# Patient Record
Sex: Male | Born: 1970 | Race: Black or African American | Hispanic: No | Marital: Married | Smoking: Current every day smoker
Health system: Southern US, Community
[De-identification: ages and names within clinical notes are randomized; demographics above are authoritative.]

## PROBLEM LIST (undated history)

## (undated) DIAGNOSIS — N289 Disorder of kidney and ureter, unspecified: Secondary | ICD-10-CM

## (undated) DIAGNOSIS — N200 Calculus of kidney: Secondary | ICD-10-CM

---

## 2007-04-17 ENCOUNTER — Emergency Department: Payer: Self-pay | Admitting: Emergency Medicine

## 2008-07-05 ENCOUNTER — Emergency Department: Payer: Self-pay | Admitting: Emergency Medicine

## 2009-08-24 ENCOUNTER — Emergency Department: Payer: Self-pay | Admitting: Emergency Medicine

## 2010-10-06 ENCOUNTER — Emergency Department: Payer: Self-pay | Admitting: Emergency Medicine

## 2012-08-01 ENCOUNTER — Emergency Department: Payer: Self-pay | Admitting: *Deleted

## 2015-01-31 ENCOUNTER — Emergency Department (HOSPITAL_COMMUNITY): Payer: Self-pay

## 2015-01-31 ENCOUNTER — Encounter (HOSPITAL_COMMUNITY): Payer: Self-pay

## 2015-01-31 ENCOUNTER — Emergency Department (HOSPITAL_COMMUNITY)
Admission: EM | Admit: 2015-01-31 | Discharge: 2015-01-31 | Disposition: A | Discharge status: Home / Self Care | Payer: Self-pay | Attending: Emergency Medicine | Admitting: Emergency Medicine

## 2015-01-31 DIAGNOSIS — N201 Calculus of ureter: Secondary | ICD-10-CM | POA: Insufficient documentation

## 2015-01-31 DIAGNOSIS — Z720 Tobacco use: Secondary | ICD-10-CM | POA: Insufficient documentation

## 2015-01-31 DIAGNOSIS — R109 Unspecified abdominal pain: Secondary | ICD-10-CM

## 2015-01-31 HISTORY — DX: Disorder of kidney and ureter, unspecified: N28.9

## 2015-01-31 HISTORY — DX: Calculus of kidney: N20.0

## 2015-01-31 LAB — URINALYSIS, ROUTINE W REFLEX MICROSCOPIC
Glucose, UA: NEGATIVE mg/dL
KETONES UR: NEGATIVE mg/dL
LEUKOCYTES UA: NEGATIVE
NITRITE: NEGATIVE
PROTEIN: 30 mg/dL — AB
SPECIFIC GRAVITY, URINE: 1.031 — AB (ref 1.005–1.030)
UROBILINOGEN UA: 1 mg/dL (ref 0.0–1.0)
pH: 5.5 (ref 5.0–8.0)

## 2015-01-31 LAB — COMPREHENSIVE METABOLIC PANEL
ALT: 22 U/L (ref 0–53)
AST: 27 U/L (ref 0–37)
Albumin: 4 g/dL (ref 3.5–5.2)
Alkaline Phosphatase: 54 U/L (ref 39–117)
Anion gap: 8 (ref 5–15)
BUN: 11 mg/dL (ref 6–23)
CHLORIDE: 104 mmol/L (ref 96–112)
CO2: 27 mmol/L (ref 19–32)
Calcium: 8.5 mg/dL (ref 8.4–10.5)
Creatinine, Ser: 1.26 mg/dL (ref 0.50–1.35)
GFR calc Af Amer: 80 mL/min — ABNORMAL LOW (ref >90)
GFR calc non Af Amer: 69 mL/min — ABNORMAL LOW (ref >90)
Glucose, Bld: 123 mg/dL — ABNORMAL HIGH (ref 70–99)
Potassium: 3.5 mmol/L (ref 3.5–5.1)
Sodium: 139 mmol/L (ref 135–145)
Total Bilirubin: 0.5 mg/dL (ref 0.3–1.2)
Total Protein: 7.5 g/dL (ref 6.0–8.3)

## 2015-01-31 LAB — CBC WITH DIFFERENTIAL/PLATELET
Basophils Absolute: 0 10*3/uL (ref 0.0–0.1)
Basophils Relative: 1 % (ref 0–1)
EOS PCT: 0 % (ref 0–5)
Eosinophils Absolute: 0 10*3/uL (ref 0.0–0.7)
HEMATOCRIT: 43.1 % (ref 39.0–52.0)
Hemoglobin: 14.3 g/dL (ref 13.0–17.0)
LYMPHS PCT: 25 % (ref 12–46)
Lymphs Abs: 1.6 10*3/uL (ref 0.7–4.0)
MCH: 29 pg (ref 26.0–34.0)
MCHC: 33.2 g/dL (ref 30.0–36.0)
MCV: 87.4 fL (ref 78.0–100.0)
Monocytes Absolute: 0.6 10*3/uL (ref 0.1–1.0)
Monocytes Relative: 9 % (ref 3–12)
NEUTROS ABS: 4.1 10*3/uL (ref 1.7–7.7)
Neutrophils Relative %: 65 % (ref 43–77)
Platelets: 227 10*3/uL (ref 150–400)
RBC: 4.93 MIL/uL (ref 4.22–5.81)
RDW: 13 % (ref 11.5–15.5)
WBC: 6.3 10*3/uL (ref 4.0–10.5)

## 2015-01-31 LAB — URINE MICROSCOPIC-ADD ON

## 2015-01-31 MED ORDER — ONDANSETRON HCL 4 MG/2ML IJ SOLN
4.0000 mg | Freq: Once | INTRAMUSCULAR | Status: AC
  Administered 2015-01-31: 4 mg via INTRAVENOUS
  Filled 2015-01-31: qty 2

## 2015-01-31 MED ORDER — MORPHINE SULFATE 4 MG/ML IJ SOLN
4.0000 mg | Freq: Once | INTRAMUSCULAR | Status: AC
  Administered 2015-01-31: 4 mg via INTRAVENOUS
  Filled 2015-01-31: qty 1

## 2015-01-31 MED ORDER — ONDANSETRON HCL 4 MG PO TABS
4.0000 mg | ORAL_TABLET | Freq: Four times a day (QID) | ORAL | Status: AC
Start: 2015-01-31 — End: ?

## 2015-01-31 MED ORDER — OXYCODONE-ACETAMINOPHEN 5-325 MG PO TABS: 1.0000 | ORAL_TABLET | Freq: Four times a day (QID) | ORAL | Status: AC | PRN

## 2015-01-31 MED ORDER — HYDROMORPHONE HCL 1 MG/ML IJ SOLN
1.0000 mg | Freq: Once | INTRAMUSCULAR | Status: AC
  Administered 2015-01-31: 1 mg via INTRAVENOUS
  Filled 2015-01-31: qty 1

## 2015-01-31 MED ORDER — SODIUM CHLORIDE 0.9 % IV BOLUS (SEPSIS)
1000.0000 mL | Freq: Once | INTRAVENOUS | Status: AC
Start: 2015-01-31 — End: 2015-01-31
  Administered 2015-01-31: 1000 mL via INTRAVENOUS

## 2015-01-31 MED ORDER — TAMSULOSIN HCL 0.4 MG PO CAPS: 0.4000 mg | ORAL_CAPSULE | Freq: Every day | ORAL | Status: AC

## 2015-01-31 NOTE — ED Notes (Signed)
Went to draw blood, Pt in CT

## 2015-01-31 NOTE — ED Notes (Signed)
Per [facility]- Pt c/o of sudden onset  of urinary retention and right flank pain. Denies N/V/D and fever. Toradol 30mg  IVP and 150mcg Fentanyl IVP given in route. Pain 5/10

## 2015-01-31 NOTE — ED Notes (Signed)
Pt asked for urine one more time, pt was informed of order to IO cath, agreed to try to void.  Will recheck shortly

## 2015-01-31 NOTE — ED Provider Notes (Signed)
CSN: [id number]     Arrival date & time 01/31/15  4098 History   First MD Initiated Contact with Patient 01/31/15 [phone]     Chief Complaint  Patient presents with  . Flank Pain  . Urine Output    retention  . Abdominal Pain    RLQ     (Consider location/radiation/quality/duration/timing/severity/associated sxs/prior Treatment) Patient is a 44 y.o. male presenting with abdominal pain.  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 Worsened by:  Nothing tried Associated symptoms: no chest pain, no diarrhea, no fever, no nausea and no vomiting     Past Medical History  Diagnosis Date  . Renal disorder   . Kidney stone    History reviewed. No pertinent past surgical history. History reviewed. No pertinent family history. History  Substance Use Topics  . Smoking status: Current Every Day Smoker  . Smokeless tobacco: Not on file  . Alcohol Use: Yes     Comment: social    Review of Systems  Constitutional: Negative for fever.  Cardiovascular: Negative for chest pain.  Gastrointestinal: Positive for abdominal pain. Negative for nausea, vomiting and diarrhea.  Genitourinary: Positive for flank pain.  All other systems reviewed and are negative.     Allergies  Review of patient's allergies indicates no known allergies.  Home Medications   Prior to Admission medications   Not on File   BP 143/97 mmHg  Pulse 71  Temp(Src) 97.9 F (36.6 C) (Oral)  Resp 25  SpO2 99% Physical Exam  Constitutional: He is oriented to person, place, and time. He appears  well-developed and well-nourished. No distress.  HENT:  Head: Normocephalic and atraumatic.  Eyes: Conjunctivae are normal. No scleral icterus.  Neck: Neck supple.  Cardiovascular: Normal rate and intact distal pulses.   Pulmonary/Chest: Effort normal. No stridor. No respiratory distress.  Abdominal: Normal appearance. He exhibits no distension. There is tenderness (right flank and RLQ. mild. ). There is no rigidity, no rebound and no guarding.  Genitourinary: Right testis shows no mass, no swelling and no tenderness. Left testis shows no mass, no swelling and no tenderness.  Neurological: He is alert and oriented to person, place, and time.  Skin: Skin is warm and dry. No rash noted.  Psychiatric: He has a normal mood and affect. His behavior is normal.  Nursing note and vitals reviewed.   ED Course  Procedures (including critical care time) Labs Review Labs Reviewed  URINALYSIS, ROUTINE W REFLEX MICROSCOPIC - Abnormal; Notable for the following:    Color, Urine AMBER (*)    APPearance TURBID (*)    Specific Gravity, Urine 1.031 (*)    Hgb urine dipstick LARGE (*)    Bilirubin Urine SMALL (*)    Protein, ur 30 (*)    All other components within normal limits  COMPREHENSIVE METABOLIC PANEL - Abnormal; Notable for the following:    Glucose, Bld 123 (*)    GFR calc non Af Amer 69 (*)    GFR calc Af Amer 80 (*)    All other components within normal limits  URINE MICROSCOPIC-ADD  ON - Abnormal; Notable for the following:    Squamous Epithelial / LPF MANY (*)    Bacteria, UA MANY (*)    Casts HYALINE CASTS (*)    All other components within normal limits  CBC WITH DIFFERENTIAL/PLATELET    Imaging Review Ct Abdomen Pelvis Wo Contrast  01/31/2015   CLINICAL DATA:  44 year old with right flank pain and urinary retention since this morning.  EXAM: CT ABDOMEN AND PELVIS WITHOUT CONTRAST  TECHNIQUE: Multidetector CT imaging of the abdomen and pelvis was performed following the standard  protocol without IV contrast.  COMPARISON:  None.  FINDINGS: Abdominal structures extend into the left lower chest and findings most likely represent a diaphragmatic hernia rather than just an elevated hemidiaphragm. 2 mm nodule in the right lower lobe on sequence 3, image 9 is indeterminate but could be incidental. No evidence for free air.  There are 2 stones in the right kidney, largest in the interpolar region and measuring 4 mm. There is mild right perinephric stranding. Mild to moderate dilatation of the right ureter due to a 3 mm stone in the distal right ureter just proximal to the ureterovesical junction.  No evidence for left kidney stones or left hydronephrosis. Normal appearance of the adrenal tissue. No gross abnormality to the liver, gallbladder, spleen or pancreas on this non contrast examination. Orientation of the left upper abdominal structures is altered due to the hernia.  No significant free fluid or lymphadenopathy. Urinary bladder is decompressed. No gross abnormality to the prostate gland. Normal appearance of the appendix in the right lower quadrant. No acute abnormality to the small or large bowel. There is an umbilical hernia containing fat.  No acute bone abnormality.  IMPRESSION: Mild-to-moderate right hydroureteronephrosis due to a 3 mm stone in the distal right ureter. There are at least 2 right kidney stones.  Evidence for a left diaphragmatic hernia.  2 mm nodular density in the right lower lobe is indeterminate. If the patient is at high risk for bronchogenic carcinoma, follow-up chest CT at 1 year is recommended. If the patient is at low risk, no follow-up is needed. This recommendation follows the consensus statement: Guidelines for Management of Small Pulmonary Nodules Detected on CT Scans: A Statement from the Fleischner Society as published in Radiology 2005; 237:395-400.  Small umbilical hernia.   Electronically Signed   By: Richarda OverlieAdam  Henn M.D.   On: 01/31/2015 09:59   All  radiology studies independently viewed by me.      EKG Interpretation None      MDM   Final diagnoses:  Right flank pain  Right ureteral stone    Right flank and RLQ abdominal pain.  Somewhat better after EMS gave toradol and fentanyl.  Plan labs, CT.  IV morphine for additional pain control.   CT shows 3mm stone.  Pain controlled with IV narcotics.  Plan dc home with flomax, percocet, zofran, strainer, and Urology follow up.    Blake DivineJohn Etai Copado, MD 01/31/15 [phone]

## 2015-01-31 NOTE — ED Notes (Signed)
Back from CT

## 2015-01-31 NOTE — ED Notes (Signed)
Bed: [id number] Expected date:  Expected time:  Means of arrival:  Comments: Flank pain

## 2015-01-31 NOTE — ED Notes (Signed)
MD at bedside. 

## 2015-01-31 NOTE — Discharge Instructions (Signed)
Kidney Stones  Kidney stones (urolithiasis) are deposits that form inside your kidneys. The intense pain is caused by the stone moving through the urinary tract. When the stone moves, the ureter goes into spasm around the stone. The stone is usually passed in the urine.   CAUSES   · A disorder that makes certain neck glands produce too much parathyroid hormone (primary hyperparathyroidism).  · A buildup of uric acid crystals, similar to gout in your joints.  · Narrowing (stricture) of the ureter.  · A kidney obstruction present at birth (congenital obstruction).  · Previous surgery on the kidney or ureters.  · Numerous kidney infections.  SYMPTOMS   · Feeling sick to your stomach (nauseous).  · Throwing up (vomiting).  · Blood in the urine (hematuria).  · Pain that usually spreads (radiates) to the groin.  · Frequency or urgency of urination.  DIAGNOSIS   · Taking a history and physical exam.  · Blood or urine tests.  · CT scan.  · Occasionally, an examination of the inside of the urinary bladder (cystoscopy) is performed.  TREATMENT   · Observation.  · Increasing your fluid intake.  · Extracorporeal shock wave lithotripsy--This is a noninvasive procedure that uses shock waves to break up kidney stones.  · Surgery may be needed if you have severe pain or persistent obstruction. There are various surgical procedures. Most of the procedures are performed with the use of small instruments. Only small incisions are needed to accommodate these instruments, so recovery time is minimized.  The size, location, and chemical composition are all important variables that will determine the proper choice of action for you. Talk to your health care provider to better understand your situation so that you will minimize the risk of injury to yourself and your kidney.   HOME CARE INSTRUCTIONS   · Drink enough water and fluids to keep your urine clear or pale yellow. This will help you to pass the stone or stone fragments.  · Strain  all urine through the provided strainer. Keep all particulate matter and stones for your health care provider to see. The stone causing the pain may be as small as a grain of salt. It is very important to use the strainer each and every time you pass your urine. The collection of your stone will allow your health care provider to analyze it and verify that a stone has actually passed. The stone analysis will often identify what you can do to reduce the incidence of recurrences.  · Only take over-the-counter or prescription medicines for pain, discomfort, or fever as directed by your health care provider.  · Make a follow-up appointment with your health care provider as directed.  · Get follow-up X-rays if required. The absence of pain does not always mean that the stone has passed. It may have only stopped moving. If the urine remains completely obstructed, it can cause loss of kidney function or even complete destruction of the kidney. It is your responsibility to make sure X-rays and follow-ups are completed. Ultrasounds of the kidney can show blockages and the status of the kidney. Ultrasounds are not associated with any radiation and can be performed easily in a matter of minutes.  SEEK MEDICAL CARE IF:  · You experience pain that is progressive and unresponsive to any pain medicine you have been prescribed.  SEEK IMMEDIATE MEDICAL CARE IF:   · Pain cannot be controlled with the prescribed medicine.  · You have a fever or   shaking chills.  · The severity or intensity of pain increases over 18 hours and is not relieved by pain medicine.  · You develop a new onset of abdominal pain.  · You feel faint or pass out.  · You are unable to urinate.  MAKE SURE YOU:   · Understand these instructions.  · Will watch your condition.  · Will get help right away if you are not doing well or get worse.  Document Released: 11/02/2005 Document Revised: 07/05/2013 Document Reviewed: 04/05/2013  ExitCare® Patient Information ©2015  [employer]. This information is not intended to replace advice given to you by your health care provider. Make sure you discuss any questions you have with your health care provider.    Dietary Guidelines to Help Prevent Kidney Stones  Your risk of kidney stones can be decreased by adjusting the foods you eat. The most important thing you can do is drink enough fluid. You should drink enough fluid to keep your urine clear or pale yellow. The following guidelines provide specific information for the type of kidney stone you have had.  GUIDELINES ACCORDING TO TYPE OF KIDNEY STONE  Calcium Oxalate Kidney Stones  · Reduce the amount of salt you eat. Foods that have a lot of salt cause your body to release excess calcium into your urine. The excess calcium can combine with a substance called oxalate to form kidney stones.  · Reduce the amount of animal protein you eat if the amount you eat is excessive. Animal protein causes your body to release excess calcium into your urine. Ask your dietitian how much protein from animal sources you should be eating.  · Avoid foods that are high in oxalates. If you take vitamins, they should have less than 500 mg of vitamin C. Your body turns vitamin C into oxalates. You do not need to avoid fruits and vegetables high in vitamin C.  Calcium Phosphate Kidney Stones  · Reduce the amount of salt you eat to help prevent the release of excess calcium into your urine.  · Reduce the amount of animal protein you eat if the amount you eat is excessive. Animal protein causes your body to release excess calcium into your urine. Ask your dietitian how much protein from animal sources you should be eating.  · Get enough calcium from food or take a calcium supplement (ask your dietitian for recommendations). Food sources of calcium that do not increase your risk of kidney stones include:  ¨ Broccoli.  ¨ Dairy products, such as cheese and yogurt.  ¨ Pudding.  Uric Acid Kidney Stones  · Do not  have more than 6 oz of animal protein per day.  FOOD SOURCES  Animal Protein Sources  · Meat (all types).  · Poultry.  · Eggs.  · Fish, seafood.  Foods High in Salt  · Salt seasonings.  · Soy sauce.  · Teriyaki sauce.  · Cured and processed meats.  · Salted crackers and snack foods.  · Fast food.  · Canned soups and most canned foods.  Foods High in Oxalates  · Grains:  ¨ Amaranth.  ¨ Barley.  ¨ Grits.  ¨ Wheat germ.  ¨ Bran.  ¨ Buckwheat flour.  ¨ All bran cereals.  ¨ Pretzels.  ¨ Whole wheat bread.  · Vegetables:  ¨ Beans (wax).  ¨ Beets and beet greens.  ¨ Collard greens.  ¨ Eggplant.  ¨ Escarole.  ¨ Leeks.  ¨ Okra.  ¨ Parsley.  ¨ Rutabagas.  ¨   Spinach.  ¨ Swiss chard.  ¨ Tomato paste.  ¨ Fried potatoes.  ¨ Sweet potatoes.  · Fruits:  ¨ Red currants.  ¨ Figs.  ¨ Kiwi.  ¨ Rhubarb.  · Meat and Other Protein Sources:  ¨ Beans (dried).  ¨ Soy burgers and other soybean products.  ¨ Miso.  ¨ Nuts (peanuts, almonds, pecans, cashews, hazelnuts).  ¨ Nut butters.  ¨ Sesame seeds and tahini (paste made of sesame seeds).  ¨ Poppy seeds.  · Beverages:  ¨ Chocolate drink mixes.  ¨ Soy milk.  ¨ Instant iced tea.  ¨ Juices made from high-oxalate fruits or vegetables.  · Other:  ¨ Carob.  ¨ Chocolate.  ¨ Fruitcake.  ¨ Marmalades.  Document Released: 02/27/2011 Document Revised: 11/07/2013 Document Reviewed: 09/29/2013  ExitCare® Patient Information ©2015 [employer]. This information is not intended to replace advice given to you by your health care provider. Make sure you discuss any questions you have with your health care provider.

## 2015-02-02 LAB — URINE CULTURE
CULTURE: NO GROWTH
Colony Count: NO GROWTH

## 2018-02-06 ENCOUNTER — Emergency Department
Admission: EM | Admit: 2018-02-06 | Discharge: 2018-02-06 | Disposition: A | Discharge status: Home / Self Care | Payer: Self-pay | Attending: Student in an Organized Health Care Education/Training Program | Admitting: Student in an Organized Health Care Education/Training Program

## 2018-02-06 ENCOUNTER — Encounter: Payer: Self-pay | Admitting: Emergency Medicine

## 2018-02-06 ENCOUNTER — Other Ambulatory Visit: Payer: Self-pay

## 2018-02-06 ENCOUNTER — Emergency Department: Payer: Self-pay

## 2018-02-06 DIAGNOSIS — Z79899 Other long term (current) drug therapy: Secondary | ICD-10-CM | POA: Insufficient documentation

## 2018-02-06 DIAGNOSIS — R1031 Right lower quadrant pain: Secondary | ICD-10-CM

## 2018-02-06 DIAGNOSIS — F17200 Nicotine dependence, unspecified, uncomplicated: Secondary | ICD-10-CM | POA: Insufficient documentation

## 2018-02-06 DIAGNOSIS — N201 Calculus of ureter: Secondary | ICD-10-CM | POA: Insufficient documentation

## 2018-02-06 LAB — COMPREHENSIVE METABOLIC PANEL
ALBUMIN: 4.2 g/dL (ref 3.5–5.0)
ALT: 12 U/L — ABNORMAL LOW (ref 17–63)
AST: 22 U/L (ref 15–41)
Alkaline Phosphatase: 57 U/L (ref 38–126)
Anion gap: 8 (ref 5–15)
BILIRUBIN TOTAL: 0.5 mg/dL (ref 0.3–1.2)
BUN: 12 mg/dL (ref 6–20)
CO2: 29 mmol/L (ref 22–32)
CREATININE: 1.13 mg/dL (ref 0.61–1.24)
Calcium: 9.1 mg/dL (ref 8.9–10.3)
Chloride: 106 mmol/L (ref 101–111)
GFR calc Af Amer: >60 mL/min (ref >60)
GLUCOSE: 117 mg/dL — AB (ref 65–99)
Potassium: 3.1 mmol/L — ABNORMAL LOW (ref 3.5–5.1)
Sodium: 143 mmol/L (ref 135–145)
TOTAL PROTEIN: 7.6 g/dL (ref 6.5–8.1)

## 2018-02-06 LAB — CBC
HCT: 41.6 % (ref 40.0–52.0)
Hemoglobin: 13.9 g/dL (ref 13.0–18.0)
MCH: 28.8 pg (ref 26.0–34.0)
MCHC: 33.5 g/dL (ref 32.0–36.0)
MCV: 86 fL (ref 80.0–100.0)
PLATELETS: 247 10*3/uL (ref 150–440)
RBC: 4.84 MIL/uL (ref 4.40–5.90)
RDW: 13.7 % (ref 11.5–14.5)
WBC: 8.5 10*3/uL (ref 3.8–10.6)

## 2018-02-06 LAB — URINALYSIS, COMPLETE (UACMP) WITH MICROSCOPIC
BACTERIA UA: NONE SEEN
Bilirubin Urine: NEGATIVE
GLUCOSE, UA: NEGATIVE mg/dL
Ketones, ur: NEGATIVE mg/dL
LEUKOCYTES UA: NEGATIVE
NITRITE: NEGATIVE
PROTEIN: NEGATIVE mg/dL
SPECIFIC GRAVITY, URINE: 1.021 (ref 1.005–1.030)
pH: 7 (ref 5.0–8.0)

## 2018-02-06 LAB — LIPASE, BLOOD: Lipase: 189 U/L — ABNORMAL HIGH (ref 11–51)

## 2018-02-06 MED ORDER — PROMETHAZINE HCL 12.5 MG PO TABS: 12.5000 mg | ORAL_TABLET | Freq: Four times a day (QID) | ORAL | 0 refills | Status: AC | PRN

## 2018-02-06 MED ORDER — PROMETHAZINE HCL 25 MG/ML IJ SOLN
12.5000 mg | Freq: Four times a day (QID) | INTRAMUSCULAR | Status: DC | PRN
  Administered 2018-02-06: 12.5 mg via INTRAVENOUS
  Filled 2018-02-06: qty 1

## 2018-02-06 MED ORDER — KETOROLAC TROMETHAMINE 30 MG/ML IJ SOLN
15.0000 mg | Freq: Once | INTRAMUSCULAR | Status: AC
  Administered 2018-02-06: 15 mg via INTRAVENOUS
  Filled 2018-02-06: qty 1

## 2018-02-06 MED ORDER — MORPHINE SULFATE (PF) 4 MG/ML IV SOLN
4.0000 mg | INTRAVENOUS | Status: DC | PRN
  Administered 2018-02-06: 4 mg via INTRAVENOUS
  Filled 2018-02-06: qty 1

## 2018-02-06 MED ORDER — HYDROCODONE-ACETAMINOPHEN 5-325 MG PO TABS: 1.0000 | ORAL_TABLET | ORAL | 0 refills | Status: AC | PRN

## 2018-02-06 MED ORDER — SODIUM CHLORIDE 0.9 % IV BOLUS (SEPSIS)
1000.0000 mL | Freq: Once | INTRAVENOUS | Status: AC
  Administered 2018-02-06: 1000 mL via INTRAVENOUS

## 2018-02-06 MED ORDER — TAMSULOSIN HCL 0.4 MG PO CAPS: 0.4000 mg | ORAL_CAPSULE | Freq: Every day | ORAL | 0 refills | Status: AC

## 2018-02-06 NOTE — ED Notes (Signed)
Pt given gingerale for PO challenge 

## 2018-02-06 NOTE — ED Triage Notes (Signed)
Pt reports RLQ pain since Friday reports he has a history of kidney stones reports pain feels like a kidney stone pointing a RLQ. Pt denies any N/V/D

## 2018-02-06 NOTE — ED Notes (Signed)
Patient updated on wait on time. Patient verbalizes understanding.

## 2018-02-06 NOTE — ED Provider Notes (Signed)
[facility] Emergency Department Provider Note    First MD Initiated Contact with Patient 02/06/18 0745     (approximate)  I have reviewed the triage vital signs and the nursing notes.   HISTORY  Chief Complaint Abdominal Pain    HPI James Wong. is a 47 y.o. male presents with chief complaint of moderate to severe right flank and right lower quadrant abdominal pain.  She does have a history of kidney stones and states this feels similar.  States he is also having trouble emptying his bladder.  Denies any dysuria.  No fevers.  Has had nausea with this discomfort.  No recent trauma.  Has noted darker colored urine.  Past Medical History:  Diagnosis Date  . Kidney stone   . Renal disorder    No family history on file. History reviewed. No pertinent surgical history. There are no active problems to display for this patient.     Prior to Admission medications   Medication Sig Start Date End Date Taking? Authorizing Provider  HYDROcodone-acetaminophen (NORCO) 5-325 MG tablet Take 1 tablet by mouth every 4 (four) hours as needed for moderate pain. 02/06/18   Willy Eddy, MD  ondansetron (ZOFRAN) 4 MG tablet Take 1 tablet (4 mg total) by mouth every 6 (six) hours. 01/31/15   Blake Divine, MD  oxyCODONE-acetaminophen (PERCOCET/ROXICET) 5-325 MG per tablet Take 1-2 tablets by mouth every 6 (six) hours as needed for severe pain. 01/31/15   Blake Divine, MD  promethazine (PHENERGAN) 12.5 MG tablet Take 1 tablet (12.5 mg total) by mouth every 6 (six) hours as needed for nausea or vomiting. 02/06/18   Willy Eddy, MD  tamsulosin (FLOMAX) 0.4 MG CAPS capsule Take 1 capsule (0.4 mg total) by mouth daily. 01/31/15   Blake Divine, MD  tamsulosin (FLOMAX) 0.4 MG CAPS capsule Take 1 capsule (0.4 mg total) by mouth daily after supper. 02/06/18   Willy Eddy, MD    Allergies Patient has no known allergies.    Social History Social History     Tobacco Use  . Smoking status: Current Every Day Smoker  Substance Use Topics  . Alcohol use: Yes    Comment: social  . Drug use: No    Review of Systems Patient denies headaches, rhinorrhea, blurry vision, numbness, shortness of breath, chest pain, edema, cough, abdominal pain, nausea, vomiting, diarrhea, dysuria, fevers, rashes or hallucinations unless otherwise stated above in HPI. ____________________________________________   PHYSICAL EXAM:  VITAL SIGNS: Vitals:   02/06/18 0930 02/06/18 1000  BP: 114/75 120/74  Pulse:    Resp:    Temp:    SpO2:      Constitutional: Alert and oriented. Uncomfortable appearing and in no acute distress. Eyes: Conjunctivae are normal.  Head: Atraumatic. Nose: No congestion/rhinnorhea. Mouth/Throat: Mucous membranes are moist.   Neck: No stridor. Painless ROM.  Cardiovascular: Normal rate, regular rhythm. Grossly normal heart sounds.  Good peripheral circulation. Respiratory: Normal respiratory effort.  No retractions. Lungs CTAB. Gastrointestinal: Soft and nontender. No distention. No abdominal bruits. No CVA tenderness. Genitourinary:  Musculoskeletal: No lower extremity tenderness nor edema.  No joint effusions. Neurologic:  Normal speech and language. No gross focal neurologic deficits are appreciated. No facial droop Skin:  Skin is warm, dry and intact. No rash noted. Psychiatric: Mood and affect are normal. Speech and behavior are normal.  ____________________________________________   LABS (all labs ordered are listed, but only abnormal results are displayed)  Results for orders placed or performed during  the hospital encounter of 02/06/18 (from the past 24 hour(s))  Lipase, blood     Status: Abnormal   Collection Time: 02/06/18  2:35 AM  Result Value Ref Range   Lipase 189 (H) 11 - 51 U/L  Comprehensive metabolic panel     Status: Abnormal   Collection Time: 02/06/18  2:35 AM  Result Value Ref Range   Sodium 143 135 -  145 mmol/L   Potassium 3.1 (L) 3.5 - 5.1 mmol/L   Chloride 106 101 - 111 mmol/L   CO2 29 22 - 32 mmol/L   Glucose, Bld 117 (H) 65 - 99 mg/dL   BUN 12 6 - 20 mg/dL   Creatinine, Ser 6.571.13 0.61 - 1.24 mg/dL   Calcium 9.1 8.9 - 84.610.3 mg/dL   Total Protein 7.6 6.5 - 8.1 g/dL   Albumin 4.2 3.5 - 5.0 g/dL   AST 22 15 - 41 U/L   ALT 12 (L) 17 - 63 U/L   Alkaline Phosphatase 57 38 - 126 U/L   Total Bilirubin 0.5 0.3 - 1.2 mg/dL   GFR calc non Af Amer >60 >60 mL/min   GFR calc Af Amer >60 >60 mL/min   Anion gap 8 5 - 15  CBC     Status: None   Collection Time: 02/06/18  2:35 AM  Result Value Ref Range   WBC 8.5 3.8 - 10.6 K/uL   RBC 4.84 4.40 - 5.90 MIL/uL   Hemoglobin 13.9 13.0 - 18.0 g/dL   HCT 96.241.6 95.240.0 - 84.152.0 %   MCV 86.0 80.0 - 100.0 fL   MCH 28.8 26.0 - 34.0 pg   MCHC 33.5 32.0 - 36.0 g/dL   RDW 32.413.7 40.111.5 - 02.714.5 %   Platelets 247 150 - 440 K/uL  Urinalysis, Complete w Microscopic     Status: Abnormal   Collection Time: 02/06/18  2:35 AM  Result Value Ref Range   Color, Urine YELLOW (A) YELLOW   APPearance TURBID (A) CLEAR   Specific Gravity, Urine 1.021 1.005 - 1.030   pH 7.0 5.0 - 8.0   Glucose, UA NEGATIVE NEGATIVE mg/dL   Hgb urine dipstick LARGE (A) NEGATIVE   Bilirubin Urine NEGATIVE NEGATIVE   Ketones, ur NEGATIVE NEGATIVE mg/dL   Protein, ur NEGATIVE NEGATIVE mg/dL   Nitrite NEGATIVE NEGATIVE   Leukocytes, UA NEGATIVE NEGATIVE   RBC / HPF TOO NUMEROUS TO COUNT 0 - 5 RBC/hpf   WBC, UA 0-5 0 - 5 WBC/hpf   Bacteria, UA NONE SEEN NONE SEEN   Squamous Epithelial / LPF 0-5 (A) NONE SEEN   Mucus PRESENT    Amorphous Crystal PRESENT    ____________________________________________ ____________________________________________  RADIOLOGY  I personally reviewed all radiographic images ordered to evaluate for the above acute complaints and reviewed radiology reports and findings.  These findings were personally discussed with the patient.  Please see medical record for  radiology report.  ____________________________________________   PROCEDURES  Procedure(s) performed:  Procedures    Critical Care performed: no ____________________________________________   INITIAL IMPRESSION / ASSESSMENT AND PLAN / ED COURSE  Pertinent labs & imaging results that were available during my care of the patient were reviewed by me and considered in my medical decision making (see chart for details).  DDX: stone, pyelo, colitis, appy, u ti  Hilery C Magnus Ivanhompson Jr. is a 47 y.o. who presents to the ED with p/w right flank pain and right lq pain. No fevers, no systemic symptoms. - urinary symptoms. Denies trauma  or injury. Afebrile in ED. Exam as above. Flank TTP, otherwise abdominal exam is benign. No peritoneal signs. Possible kidney stone, cystitis, or pyelonephritis.Marland Kitchen UA with hematuria, no infection.. CT Stone with acute ureterolithiasis with mild hydro.  Is mildly elevated lipase of uncertain etiology but this is not clinically consistent with acute pancreatitis.  Clinical picture is not consistent with appendicitis, diverticulitis, pancreatitis, cholecystitis, bowel perforation, aortic dissection, splenic injury or acute abdominal process at this time.    Pain improved, tolerating PO. Repeat ABD exam benign, will plan supportive treatment and early follow up for recheck.       As part of my medical decision making, I reviewed the following data within the electronic MEDICAL RECORD NUMBER Nursing notes reviewed and incorporated, Labs reviewed, notes from prior ED visits.   ____________________________________________   FINAL CLINICAL IMPRESSION(S) / ED DIAGNOSES  Final diagnoses:  Ureterolithiasis  Right lower quadrant abdominal pain      NEW MEDICATIONS STARTED DURING THIS VISIT:  New Prescriptions   HYDROCODONE-ACETAMINOPHEN (NORCO) 5-325 MG TABLET    Take 1 tablet by mouth every 4 (four) hours as needed for moderate pain.   PROMETHAZINE (PHENERGAN)  12.5 MG TABLET    Take 1 tablet (12.5 mg total) by mouth every 6 (six) hours as needed for nausea or vomiting.   TAMSULOSIN (FLOMAX) 0.4 MG CAPS CAPSULE    Take 1 capsule (0.4 mg total) by mouth daily after supper.     Note:  This document was prepared using Dragon voice recognition software and may include unintentional dictation errors.    Willy Eddy, MD 02/06/18 1028

## 2019-07-19 IMAGING — CT CT RENAL STONE PROTOCOL
2 of 4 series · 16 of 46 positions shown, 18 images · non-contrast
Comparison: CT abdomen pelvis 01/31/2015

CLINICAL DATA: Right lower quadrant flank pain. History of kidney
stones.

EXAM:
CT ABDOMEN AND PELVIS WITHOUT CONTRAST
TECHNIQUE: Multidetector CT imaging of the abdomen and pelvis was performed
following the standard protocol without IV contrast.

[Series 2: stone full standard · axial · 0.80mm/px · z∈[-1026,-561]mm · 13 of 103 slices shown, 15 images]
[im 5/103  soft-tissue]
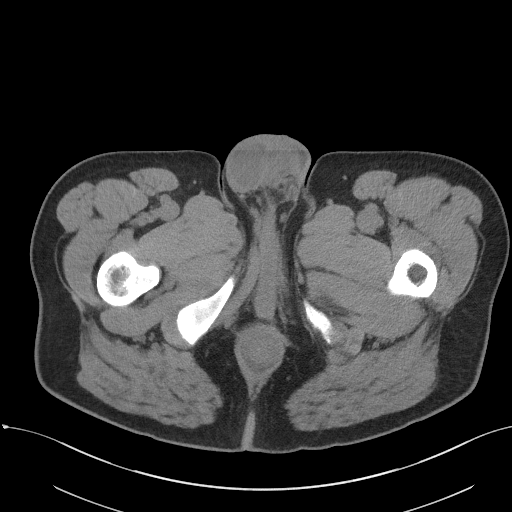
[im 5/103  bone]
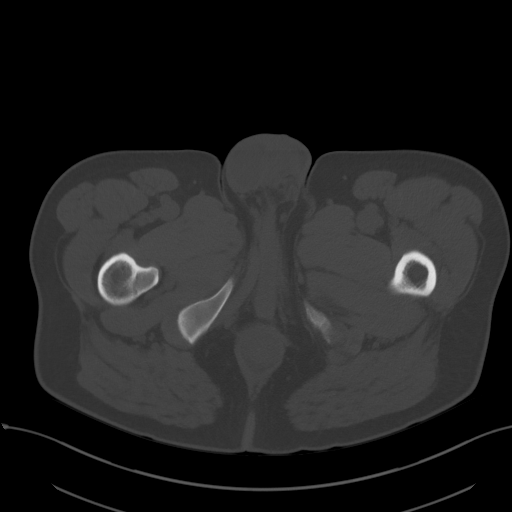
[im 14/103  soft-tissue]
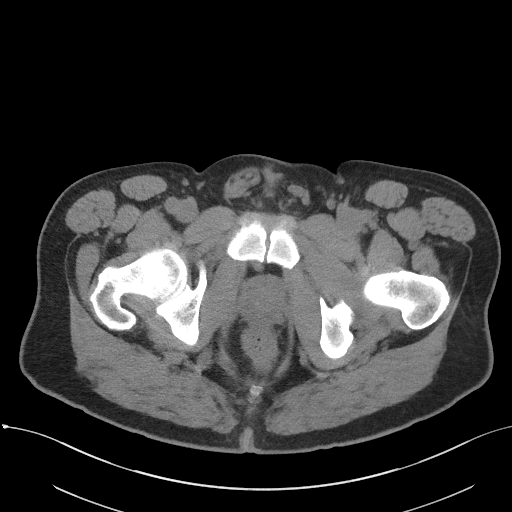
[im 23/103  soft-tissue]
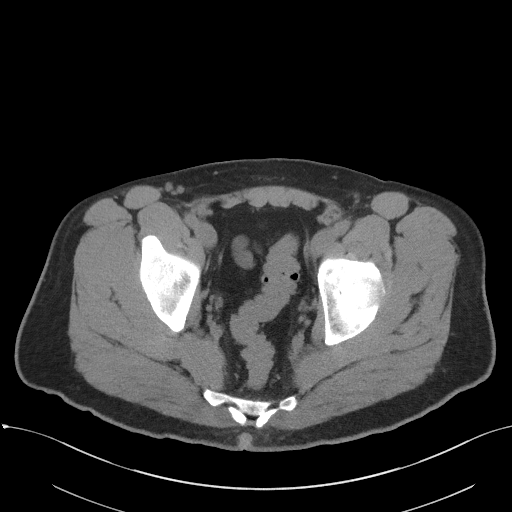
[im 27/103  soft-tissue]
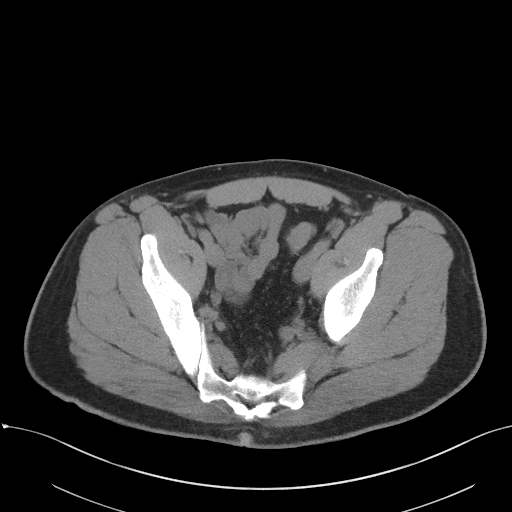
[im 36/103  soft-tissue]
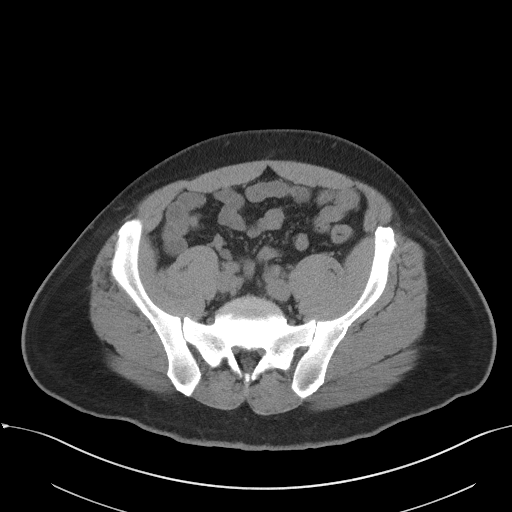
[im 45/103  soft-tissue]
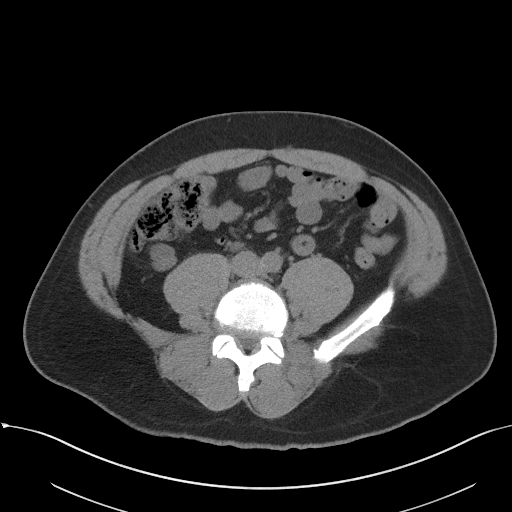
[im 54/103  soft-tissue]
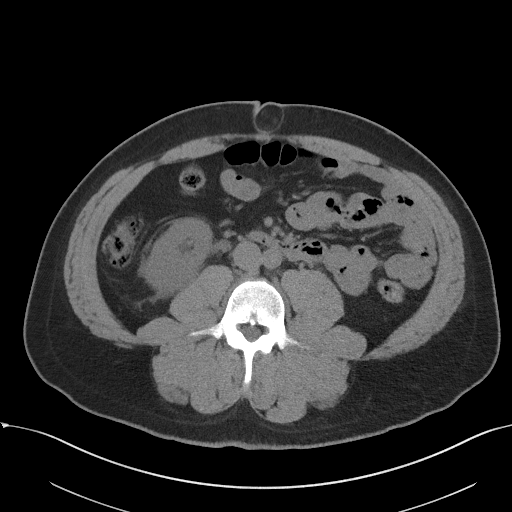
[im 58/103  soft-tissue]
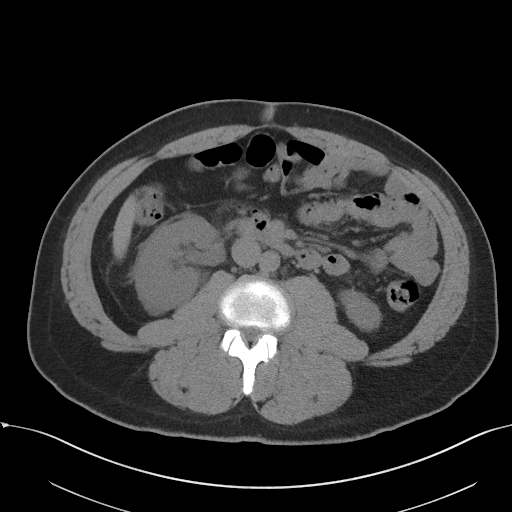
[im 67/103  soft-tissue]
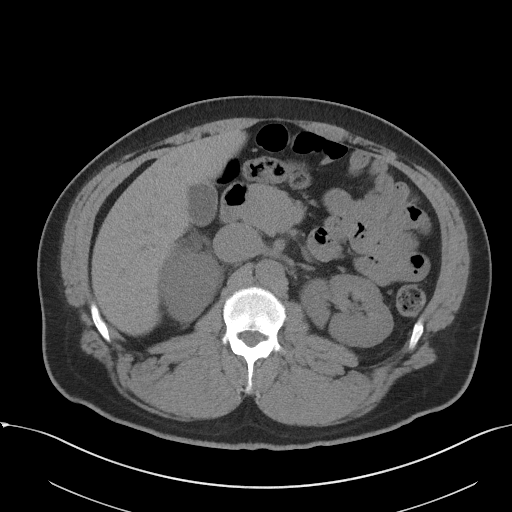
[im 67/103  bone]
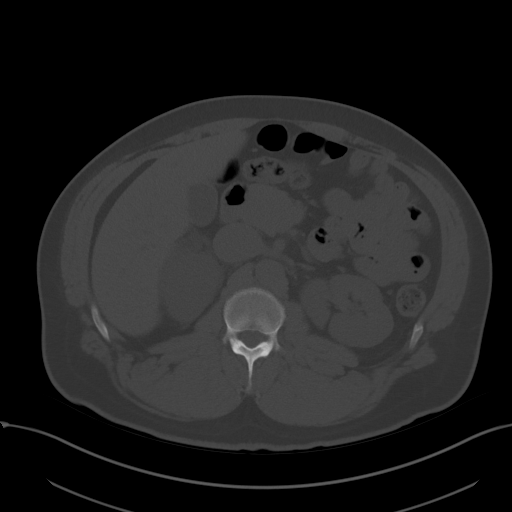
[im 76/103  soft-tissue]
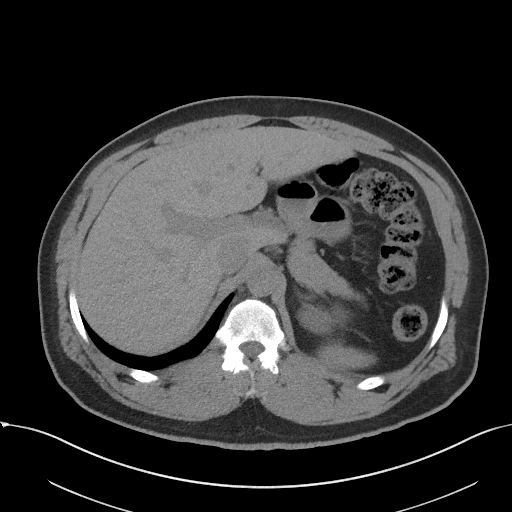
[im 80/103  soft-tissue]
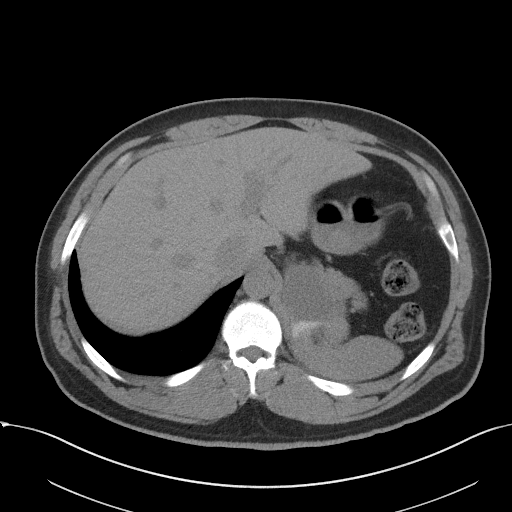
[im 89/103  soft-tissue]
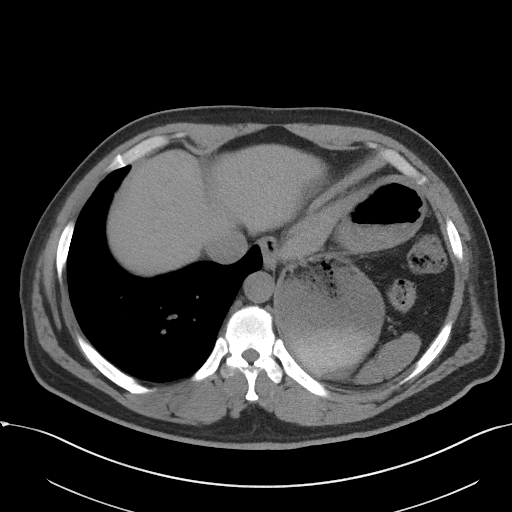
[im 98/103  soft-tissue]
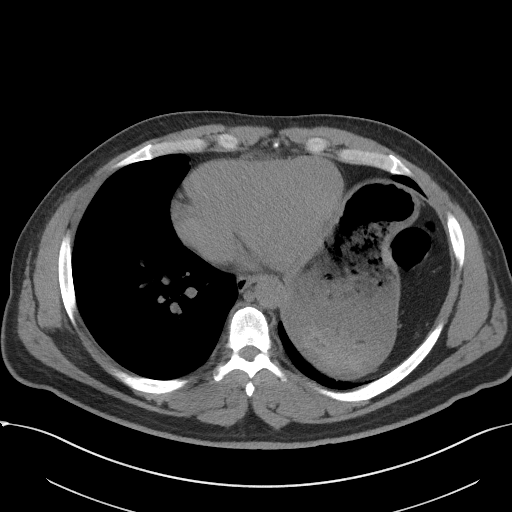

[Series 5: coronal · coronal · 0.75mm/px · 3 of 146 slices shown]
[im 49/146  soft-tissue]
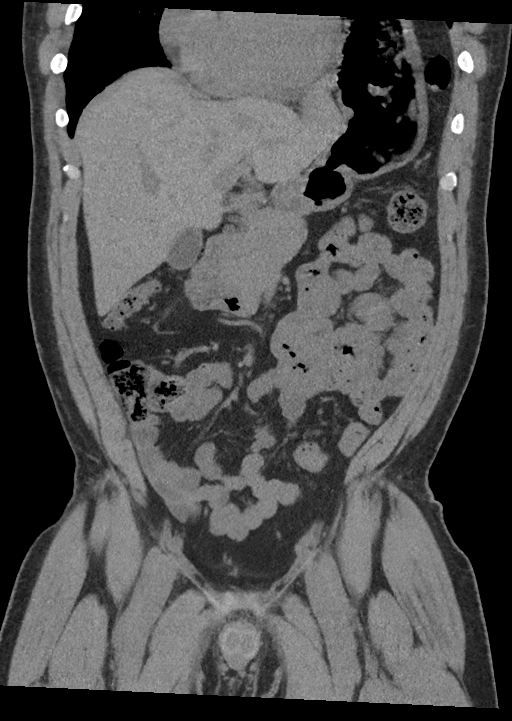
[im 65/146  soft-tissue]
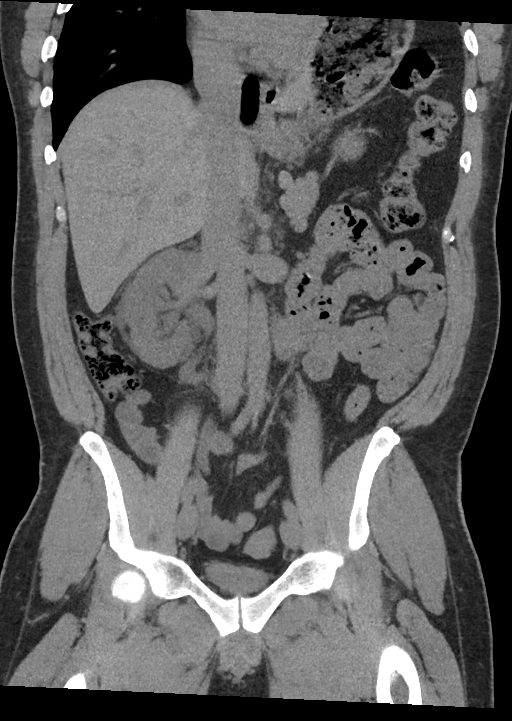
[im 81/146  soft-tissue]
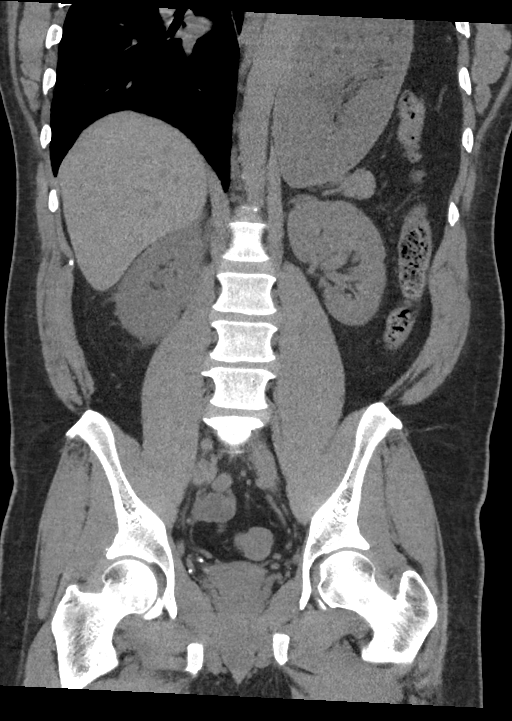

[16 of 46 positions shown; findings below may reference images not displayed]

FINDINGS: Lower chest: No basilar pulmonary nodules or pleural effusion. No
apical pericardial effusion.

Hepatobiliary: Normal hepatic contours and density. No intra- or
extrahepatic biliary dilatation. Normal gallbladder.

Pancreas: Normal parenchymal contours without ductal dilatation. No
peripancreatic fluid collection.

Spleen: Normal.

Adrenals/Urinary Tract:

--Adrenal glands: Normal.

--Right kidney/ureter: 5 x 3 mm stone at the right ureterovesical
junction, causing moderate right hydroureteronephrosis and
perinephric stranding. No other right nephroureterolithiasis.

--Left kidney/ureter: Non-obstructing 2 mm stone at the left lower
pole. Normal left ureter.

--Urinary bladder: Normal appearance for the degree of distention.

Stomach/Bowel:

--Stomach/Duodenum: No hiatal hernia or other gastric abnormality.
Normal duodenal course.

--Small bowel: No dilatation or inflammation.

--Colon: No focal abnormality.

--Appendix: Normal.

Vascular/Lymphatic: Normal course and caliber of the major abdominal
vessels. No abdominal or pelvic lymphadenopathy.

Reproductive: Normal prostate and seminal vesicles.

Musculoskeletal. Multilevel degenerative disc disease and facet
arthrosis. No bony spinal canal stenosis.

Other: None.
IMPRESSION: 1. Right-sided obstructive uropathy with 5 x 3 mm ureterovesical
junction stone causing moderate right hydroureteronephrosis and
perinephric stranding.
2. Nonobstructing left lower pole 2 mm calculus.

## 2019-12-09 ENCOUNTER — Emergency Department
Admission: EM | Admit: 2019-12-09 | Discharge: 2019-12-09 | Disposition: A | Discharge status: Home / Self Care | Payer: Self-pay | Attending: Student | Admitting: Student

## 2019-12-09 ENCOUNTER — Other Ambulatory Visit: Payer: Self-pay

## 2019-12-09 ENCOUNTER — Emergency Department: Payer: Self-pay

## 2019-12-09 DIAGNOSIS — Z87442 Personal history of urinary calculi: Secondary | ICD-10-CM | POA: Insufficient documentation

## 2019-12-09 DIAGNOSIS — N200 Calculus of kidney: Secondary | ICD-10-CM | POA: Insufficient documentation

## 2019-12-09 DIAGNOSIS — F17200 Nicotine dependence, unspecified, uncomplicated: Secondary | ICD-10-CM | POA: Insufficient documentation

## 2019-12-09 LAB — URINALYSIS, COMPLETE (UACMP) WITH MICROSCOPIC
Bacteria, UA: NONE SEEN
Bilirubin Urine: NEGATIVE
Glucose, UA: NEGATIVE mg/dL
Ketones, ur: NEGATIVE mg/dL
Leukocytes,Ua: NEGATIVE
Nitrite: NEGATIVE
Protein, ur: NEGATIVE mg/dL
RBC / HPF: >50 RBC/hpf — ABNORMAL HIGH (ref 0–5)
Specific Gravity, Urine: 1.018 (ref 1.005–1.030)
pH: 5 (ref 5.0–8.0)

## 2019-12-09 LAB — CBC
HCT: 45.1 % (ref 39.0–52.0)
Hemoglobin: 15.1 g/dL (ref 13.0–17.0)
MCH: 29.8 pg (ref 26.0–34.0)
MCHC: 33.5 g/dL (ref 30.0–36.0)
MCV: 89.1 fL (ref 80.0–100.0)
Platelets: 264 10*3/uL (ref 150–400)
RBC: 5.06 MIL/uL (ref 4.22–5.81)
RDW: 12.6 % (ref 11.5–15.5)
WBC: 7.9 10*3/uL (ref 4.0–10.5)
nRBC: 0 % (ref 0.0–0.2)

## 2019-12-09 LAB — COMPREHENSIVE METABOLIC PANEL
ALT: 22 U/L (ref 0–44)
AST: 28 U/L (ref 15–41)
Albumin: 3.9 g/dL (ref 3.5–5.0)
Alkaline Phosphatase: 61 U/L (ref 38–126)
Anion gap: 10 (ref 5–15)
BUN: 12 mg/dL (ref 6–20)
CO2: 26 mmol/L (ref 22–32)
Calcium: 8.8 mg/dL — ABNORMAL LOW (ref 8.9–10.3)
Chloride: 106 mmol/L (ref 98–111)
Creatinine, Ser: 1.21 mg/dL (ref 0.61–1.24)
GFR calc Af Amer: >60 mL/min (ref >60)
GFR calc non Af Amer: >60 mL/min (ref >60)
Glucose, Bld: 153 mg/dL — ABNORMAL HIGH (ref 70–99)
Potassium: 3.7 mmol/L (ref 3.5–5.1)
Sodium: 142 mmol/L (ref 135–145)
Total Bilirubin: 0.7 mg/dL (ref 0.3–1.2)
Total Protein: 7.6 g/dL (ref 6.5–8.1)

## 2019-12-09 LAB — LIPASE, BLOOD: Lipase: 77 U/L — ABNORMAL HIGH (ref 11–51)

## 2019-12-09 MED ORDER — HYDROMORPHONE HCL 1 MG/ML IJ SOLN
1.0000 mg | Freq: Once | INTRAMUSCULAR | Status: AC
  Administered 2019-12-09: 08:00:00 1 mg via INTRAVENOUS
  Filled 2019-12-09: qty 1

## 2019-12-09 MED ORDER — FENTANYL CITRATE (PF) 100 MCG/2ML IJ SOLN
50.0000 ug | INTRAMUSCULAR | Status: AC | PRN
  Administered 2019-12-09: 50 ug via INTRAVENOUS
  Filled 2019-12-09: qty 2

## 2019-12-09 MED ORDER — OXYCODONE-ACETAMINOPHEN 7.5-325 MG PO TABS: 1.0000 | ORAL_TABLET | Freq: Four times a day (QID) | ORAL | 0 refills | Status: AC | PRN

## 2019-12-09 MED ORDER — SODIUM CHLORIDE 0.9 % IV BOLUS
1000.0000 mL | Freq: Once | INTRAVENOUS | Status: AC
  Administered 2019-12-09: 06:00:00 1000 mL via INTRAVENOUS

## 2019-12-09 MED ORDER — FENTANYL CITRATE (PF) 100 MCG/2ML IJ SOLN
INTRAMUSCULAR | Status: AC
  Administered 2019-12-09: 50 ug via INTRAVENOUS
  Filled 2019-12-09: qty 2

## 2019-12-09 NOTE — ED Triage Notes (Signed)
Patient c/o LLQ pain X 3 days with increasing severity today. Patient reports last BM this am, normal color, smaller than normal per patient. Patient reports constant urge to urinate, with decreased urinary output.

## 2019-12-09 NOTE — Discharge Instructions (Addendum)
Follow discharge care instructions take medication as directed. °

## 2019-12-09 NOTE — ED Notes (Signed)
Pt reports pain level of 7 in his abdomen at this time. Pt denies any nausea.

## 2019-12-09 NOTE — ED Provider Notes (Signed)
[facility] Emergency Department Provider Note   ____________________________________________   First MD Initiated Contact with Patient 12/09/19 0809     (approximate)  I have reviewed the triage vital signs and the nursing notes.   HISTORY  Chief Complaint Abdominal Pain    HPI James Wingerter. is a 49 y.o. male patient presents with left lower quadrant abdominal pain for 3 days.  Patient state pain increased today.  Patient denies nausea, vomiting, diarrhea.  Patient stated urinary urgency with decreased urinary output.  Patient denies fever/chills complaint.  Patient stated  mild left CVA pain.  Past history of kidney stones.  Rates pain as 8/10.  Described pain as "achy".  No palliative measure for complaint.     Past Medical History:  Diagnosis Date  . Kidney stone   . Renal disorder     There are no problems to display for this patient.   History reviewed. No pertinent surgical history.  Prior to Admission medications   Medication Sig Start Date End Date Taking? Authorizing Provider  HYDROcodone-acetaminophen (NORCO) 5-325 MG tablet Take 1 tablet by mouth every 4 (four) hours as needed for moderate pain. 02/06/18   Willy Eddy, MD  ondansetron (ZOFRAN) 4 MG tablet Take 1 tablet (4 mg total) by mouth every 6 (six) hours. 01/31/15   Blake Divine, MD  oxyCODONE-acetaminophen (PERCOCET) 7.5-325 MG tablet Take 1 tablet by mouth every 6 (six) hours as needed. 12/09/19   Joni Reining, PA-James  oxyCODONE-acetaminophen (PERCOCET/ROXICET) 5-325 MG per tablet Take 1-2 tablets by mouth every 6 (six) hours as needed for severe pain. 01/31/15   Blake Divine, MD  promethazine (PHENERGAN) 12.5 MG tablet Take 1 tablet (12.5 mg total) by mouth every 6 (six) hours as needed for nausea or vomiting. 02/06/18   Willy Eddy, MD  tamsulosin (FLOMAX) 0.4 MG CAPS capsule Take 1 capsule (0.4 mg total) by mouth daily. 01/31/15   Blake Divine, MD    tamsulosin (FLOMAX) 0.4 MG CAPS capsule Take 1 capsule (0.4 mg total) by mouth daily after supper. 02/06/18   Willy Eddy, MD    Allergies Patient has no known allergies.  No family history on file.  Social History Social History   Tobacco Use  . Smoking status: Current Every Day Smoker  . Smokeless tobacco: Never Used  Substance Use Topics  . Alcohol use: Yes    Comment: social  . Drug use: No    Review of Systems Constitutional: No fever/chills Eyes: No visual changes. ENT: No sore throat. Cardiovascular: Denies chest pain. Respiratory: Denies shortness of breath. Gastrointestinal: Left lower abdominal pain.  No nausea, no vomiting.  No diarrhea.  No constipation. Genitourinary: Negative for dysuria.  Urinary urgency and decreased urine output. Musculoskeletal: Negative for back pain. Skin: Negative for rash. Neurological: Negative for headaches, focal weakness or numbness.   ____________________________________________   PHYSICAL EXAM:  VITAL SIGNS: ED Triage Vitals [12/09/19 0525]  Enc Vitals Group     BP (!) 155/101     Pulse Rate 80     Resp 18     Temp 99 F (37.2 James)     Temp src      SpO2 99 %     Weight 240 lb (108.9 kg)     Height 6' (1.829 m)     Head Circumference      Peak Flow      Pain Score 8     Pain Loc  Pain Edu?      Excl. in GC?    Constitutional: Alert and oriented.  Moderate distress.   Cardiovascular: Normal rate, regular rhythm. Grossly normal heart sounds.  Good peripheral circulation.  Elevated blood pressure Respiratory: Normal respiratory effort.  No retractions. Lungs CTAB. Gastrointestinal: Soft and nontender. No distention. No abdominal bruits. No CVA tenderness. Genitourinary: Deferred Neurologic:  Normal speech and language. No gross focal neurologic deficits are appreciated. No gait instability. Skin:  Skin is warm, dry and intact. No rash noted. Psychiatric: Mood and affect are normal. Speech and behavior  are normal.  ____________________________________________   LABS (all labs ordered are listed, but only abnormal results are displayed)  Labs Reviewed  LIPASE, BLOOD - Abnormal; Notable for the following components:      Result Value   Lipase 77 (*)    All other components within normal limits  COMPREHENSIVE METABOLIC PANEL - Abnormal; Notable for the following components:   Glucose, Bld 153 (*)    Calcium 8.8 (*)    All other components within normal limits  URINALYSIS, COMPLETE (UACMP) WITH MICROSCOPIC - Abnormal; Notable for the following components:   Color, Urine YELLOW (*)    APPearance CLEAR (*)    Hgb urine dipstick LARGE (*)    RBC / HPF >50 (*)    All other components within normal limits  CBC   ____________________________________________  EKG   ____________________________________________  RADIOLOGY  ED MD interpretation:    Official radiology report(s): CT Renal Stone Study  Result Date: 12/09/2019 CLINICAL DATA:  Left lower quadrant/flank pain x3 days EXAM: CT ABDOMEN AND PELVIS WITHOUT CONTRAST TECHNIQUE: Multidetector CT imaging of the abdomen and pelvis was performed following the standard protocol without IV contrast. COMPARISON:  02/06/2018 FINDINGS: Lower chest: Eventration of the left hemidiaphragm. Lingular scarring/atelectasis. Hepatobiliary: Unenhanced liver is unremarkable. Gallbladder is unremarkable. No intrahepatic or extrahepatic ductal dilatation. Pancreas: Within normal limits. Spleen: Within normal limits. Adrenals/Urinary Tract: Adrenal glands are within normal limits. Right kidney is within normal limits. 2 mm nonobstructing interpolar left renal calculus (series 2/image 51). Moderate left perirenal edema with perinephric stranding. Mild left hydroureteronephrosis. Associated 3 mm distal left ureteral calculus just above the UVJ (series 2/image 95). Thick-walled bladder, although underdistended. Stomach/Bowel: Stomach is within normal limits. No  evidence of bowel obstruction. Normal appendix (series 2/image 70). No colonic wall thickening or inflammatory changes. Vascular/Lymphatic: No evidence of abdominal aortic aneurysm. Atherosclerotic calcifications of the abdominal aorta and branch vessels. No suspicious abdominopelvic lymphadenopathy. Reproductive: Prostate is unremarkable. Other: No abdominopelvic ascites. Small fat containing periumbilical hernia (series 2/image 63). Musculoskeletal: Mild degenerative changes of the visualized thoracolumbar spine. IMPRESSION: 3 mm distal left ureteral calculus just above the UVJ. Associated mild left hydroureteronephrosis. Additional 2 mm nonobstructing interpolar left renal calculus. Electronically Signed   By: Charline Bills M.D.   On: 12/09/2019 07:13    ____________________________________________   PROCEDURES  Procedure(s) performed (including Critical Care):  Procedures   ____________________________________________   INITIAL IMPRESSION / ASSESSMENT AND PLAN / ED COURSE  As part of my medical decision making, I reviewed the following data within the electronic MEDICAL RECORD NUMBER     Patient presents with 3 days of increasing left lower quadrant abdominal pain.  Discussed labs and CT findings with patient consistent with hematuria and left kidney stones.  Patient given discharge care instruction and advised to follow-up with urology if no improvement in 3 to 5 days.  Return to ED if condition worsens.    [employer]  James Kimarion Chery. was evaluated in Emergency Department on 12/09/2019 for the symptoms described in the history of present illness. He was evaluated in the context of the global COVID-19 pandemic, which necessitated consideration that the patient might be at risk for infection with the SARS-CoV-2 virus that causes COVID-19. Institutional protocols and algorithms that pertain to the evaluation of patients at risk for COVID-19 are in a state of rapid change based on information  released by regulatory bodies including the CDC and federal and state organizations. These policies and algorithms were followed during the patient's care in the ED.       ____________________________________________   FINAL CLINICAL IMPRESSION(S) / ED DIAGNOSES  Final diagnoses:  Kidney stones     ED Discharge Orders         Ordered    oxyCODONE-acetaminophen (PERCOCET) 7.5-325 MG tablet  Every 6 hours PRN     12/09/19 1601           Note:  This document was prepared using Dragon voice recognition software and may include unintentional dictation errors.    Sable Feil, PA-James 12/09/19 0932    Lilia Pro., MD 12/10/19 [phone]

## 2021-05-20 IMAGING — CT CT RENAL STONE PROTOCOL
2 of 4 series · 15 of 46 positions shown, 17 images · non-contrast
Comparison: 02/06/2018

CLINICAL DATA: Left lower quadrant/flank pain x3 days

EXAM:
CT ABDOMEN AND PELVIS WITHOUT CONTRAST
TECHNIQUE: Multidetector CT imaging of the abdomen and pelvis was performed
following the standard protocol without IV contrast.

[Series 2: stone full standard · axial · 0.84mm/px · z∈[-924,-414]mm · 12 of 114 slices shown, 14 images]
[im 6/114  soft-tissue]
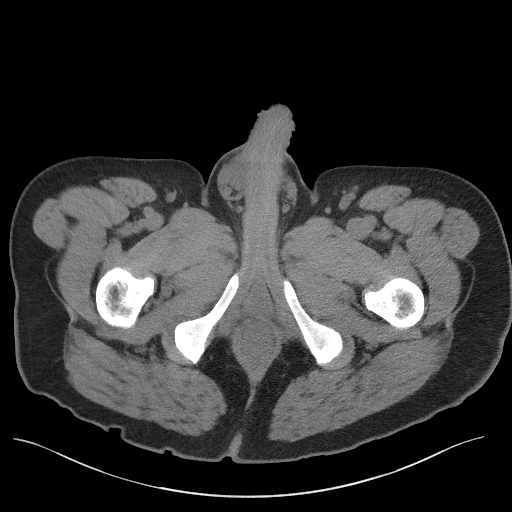
[im 6/114  bone]
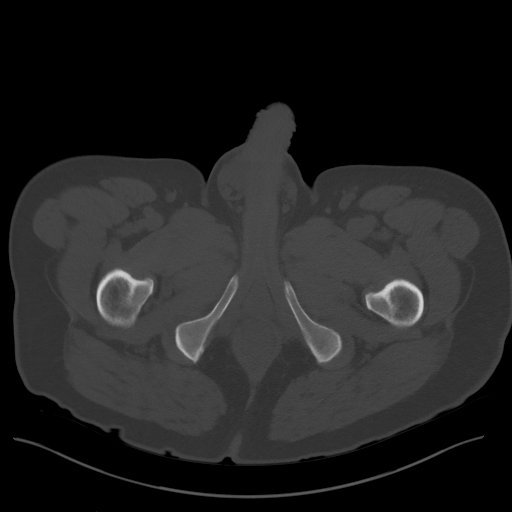
[im 16/114  soft-tissue]
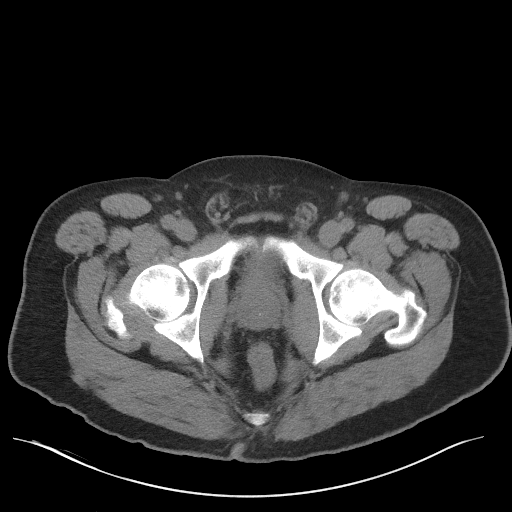
[im 26/114  soft-tissue]
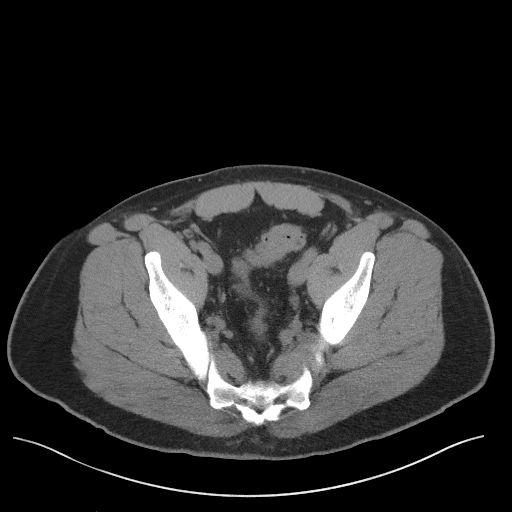
[im 36/114  soft-tissue]
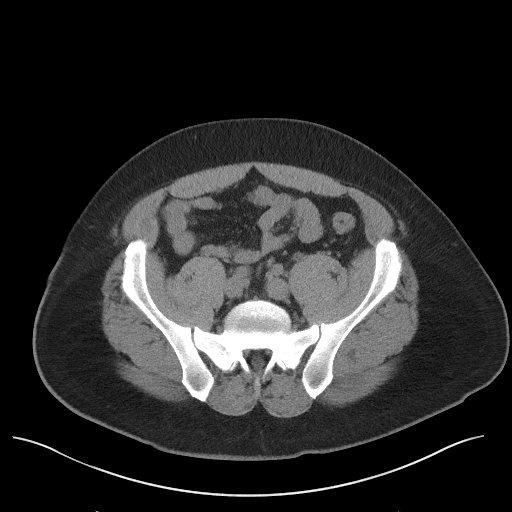
[im 42/114  soft-tissue]
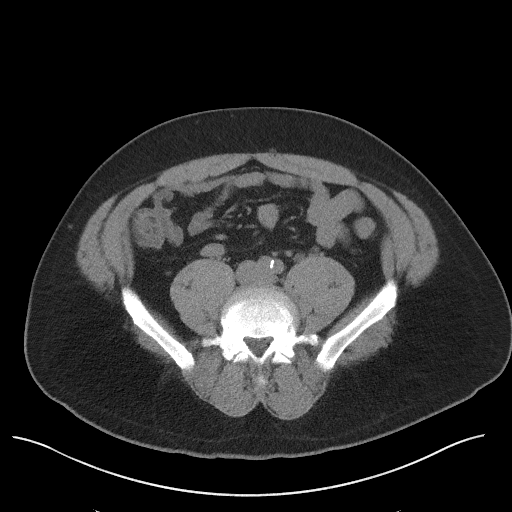
[im 52/114  soft-tissue]
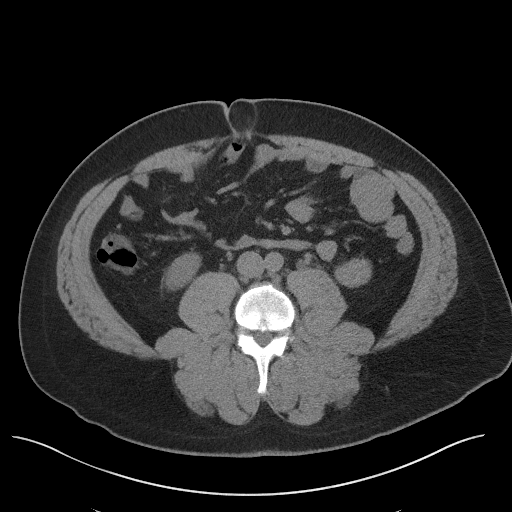
[im 62/114  soft-tissue]
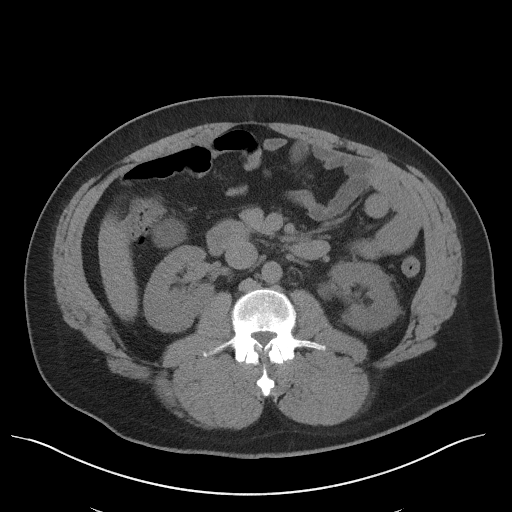
[im 72/114  soft-tissue]
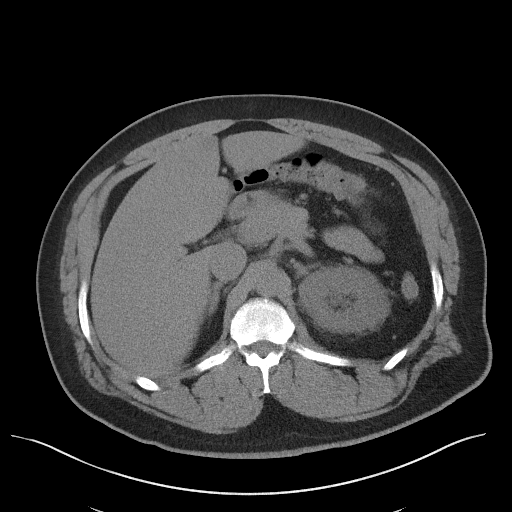
[im 78/114  soft-tissue]
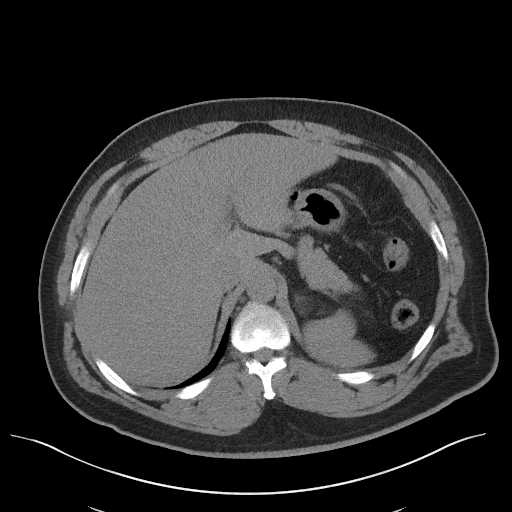
[im 78/114  bone]
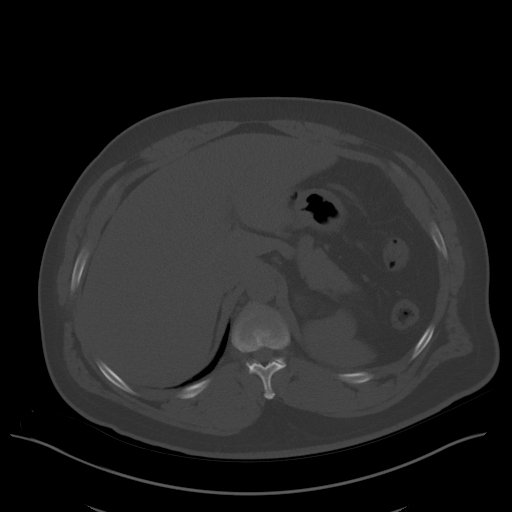
[im 88/114  soft-tissue]
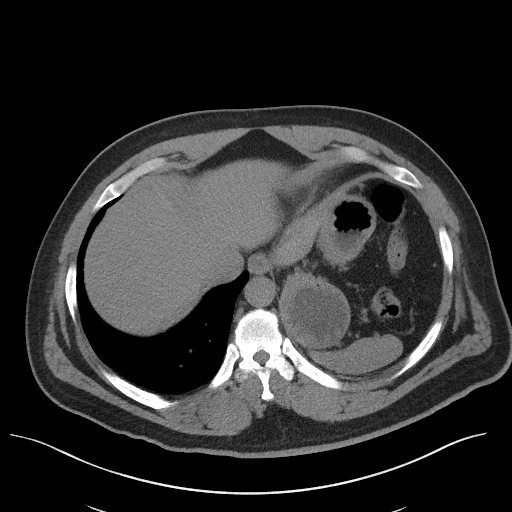
[im 98/114  soft-tissue]
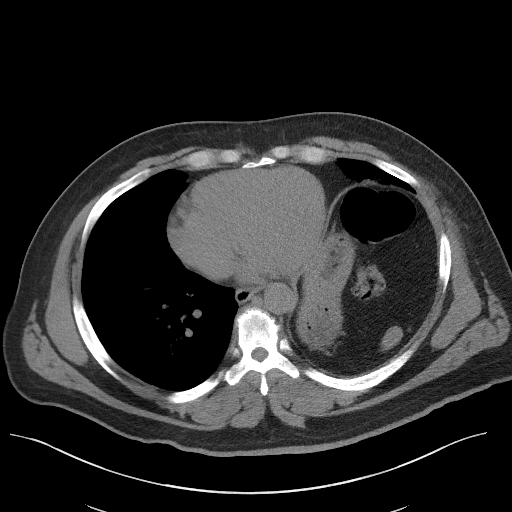
[im 108/114  soft-tissue]
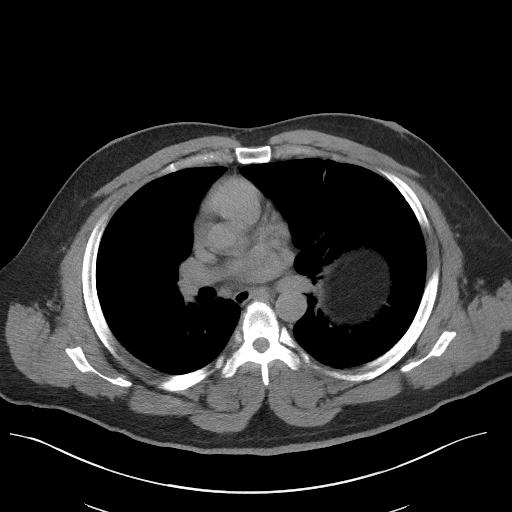

[Series 5: coronal · coronal · 0.83mm/px · 3 of 142 slices shown]
[im 48/142  soft-tissue]
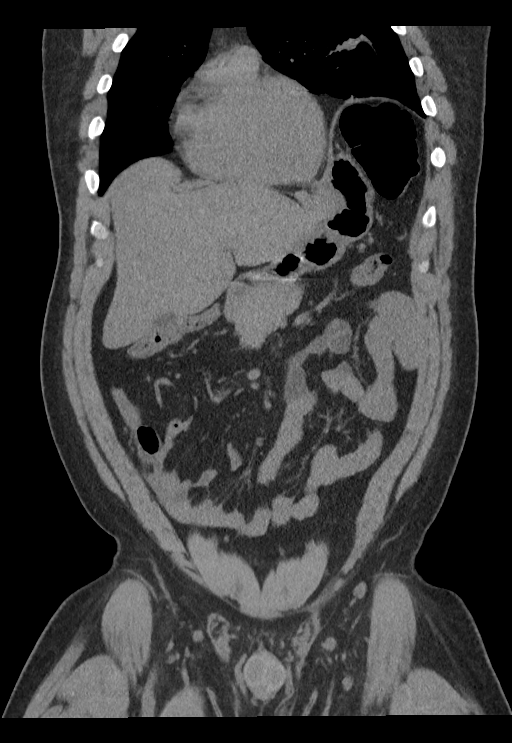
[im 63/142  soft-tissue]
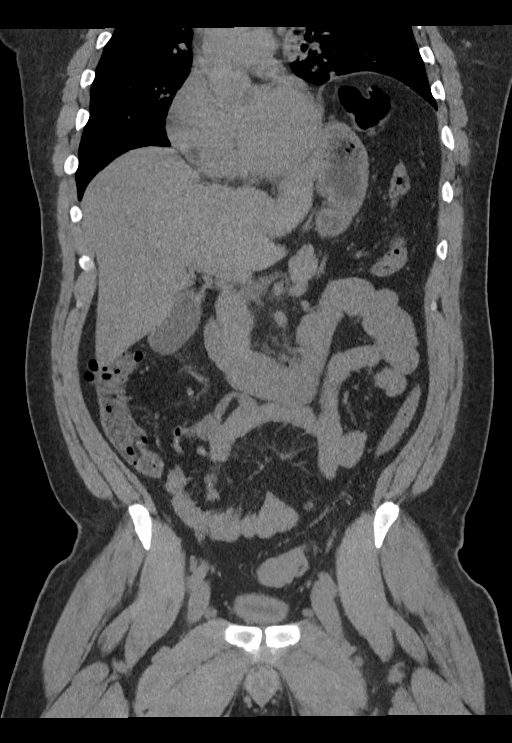
[im 79/142  soft-tissue]
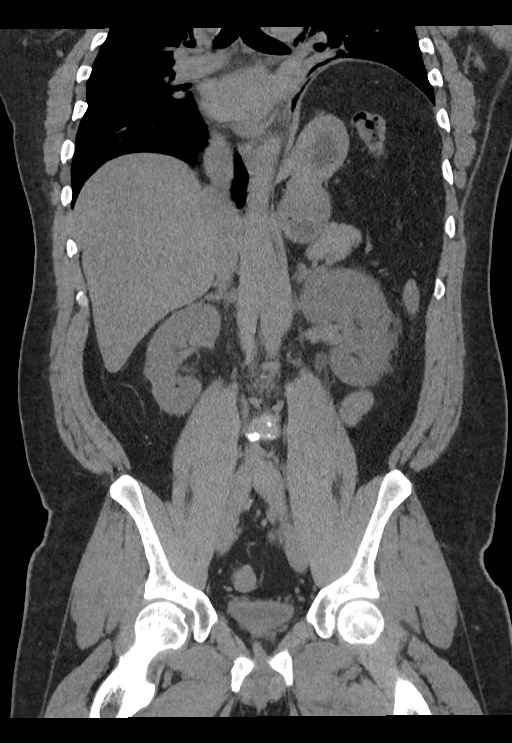

[15 of 46 positions shown; findings below may reference images not displayed]

FINDINGS: Lower chest: Eventration of the left hemidiaphragm. Lingular
scarring/atelectasis.

Hepatobiliary: Unenhanced liver is unremarkable.

Gallbladder is unremarkable. No intrahepatic or extrahepatic ductal
dilatation.

Pancreas: Within normal limits.

Spleen: Within normal limits.

Adrenals/Urinary Tract: Adrenal glands are within normal limits.

Right kidney is within normal limits. 2 mm nonobstructing interpolar
left renal calculus (series 2/image 51). Moderate left perirenal
edema with perinephric stranding. Mild left hydroureteronephrosis.
Associated 3 mm distal left ureteral calculus just above the UVJ
(series 2/image 95).

Thick-walled bladder, although underdistended.

Stomach/Bowel: Stomach is within normal limits.

No evidence of bowel obstruction.

Normal appendix (series 2/image 70).

No colonic wall thickening or inflammatory changes.

Vascular/Lymphatic: No evidence of abdominal aortic aneurysm.

Atherosclerotic calcifications of the abdominal aorta and branch
vessels.

No suspicious abdominopelvic lymphadenopathy.

Reproductive: Prostate is unremarkable.

Other: No abdominopelvic ascites.

Small fat containing periumbilical hernia (series 2/image 63).

Musculoskeletal: Mild degenerative changes of the visualized
thoracolumbar spine.
IMPRESSION: 3 mm distal left ureteral calculus just above the UVJ. Associated
mild left hydroureteronephrosis.

Additional 2 mm nonobstructing interpolar left renal calculus.

## 2022-08-13 ENCOUNTER — Ambulatory Visit: Payer: Self-pay

## 2022-09-08 ENCOUNTER — Ambulatory Visit: Payer: Self-pay | Admitting: Nurse Practitioner

## 2022-09-08 DIAGNOSIS — Z202 Contact with and (suspected) exposure to infections with a predominantly sexual mode of transmission: Secondary | ICD-10-CM

## 2022-09-08 DIAGNOSIS — Z113 Encounter for screening for infections with a predominantly sexual mode of transmission: Secondary | ICD-10-CM

## 2022-09-08 MED ORDER — METRONIDAZOLE 500 MG PO TABS: 2000.0000 mg | ORAL_TABLET | Freq: Once | ORAL | 0 refills | Status: AC

## 2022-09-08 NOTE — Progress Notes (Signed)
[facility] Department STI clinic/screening visit  Subjective:  James Wong. is a 51 y.o. male being seen today for an STI screening visit. The patient reports they do not have symptoms.    Patient has the following medical conditions:  There are no problems to display for this patient.    Chief Complaint  Patient presents with   SEXUALLY TRANSMITTED DISEASE    Pt here as contact to [facility].  Declines screening.  Only wants treatment    HPI  Patient reports to clinic today for STD screening.  Patient reports being a contact to [place].    Last HIV test per patient/review of record was: unsure   Does the patient or their partner desires a pregnancy in the next year? No  Screening for MPX risk: Does the patient have an unexplained rash? No Is the patient MSM? No Does the patient endorse multiple sex partners or anonymous sex partners? No Did the patient have close or sexual contact with a person diagnosed with MPX? No Has the patient traveled outside the [place] where MPX is endemic? No Is there a high clinical suspicion for MPX-- evidenced by one of the following No  -Unlikely to be chickenpox  -Lymphadenopathy  -Rash that present in same phase of evolution on any given body part   See flowsheet for further details and programmatic requirements.   Immunization History  Administered Date(s) Administered   Hep A / Hep B 07/04/2008   Tdap 07/04/2008     The following portions of the patient's history were reviewed and updated as appropriate: allergies, current medications, past medical history, past social history, past surgical history and problem list.  Objective:  There were no vitals filed for this visit.  Physical Exam Constitutional:      Appearance: Normal appearance.  HENT:     Head: Normocephalic. No abrasion, masses or laceration. Hair is normal.     Right Ear: External ear normal.     Left Ear: External ear normal.     Nose: Nose normal.      Mouth/Throat:     Lips: Pink.     Mouth: Mucous membranes are moist. No oral lesions.     Pharynx: No pharyngeal swelling, oropharyngeal exudate, posterior oropharyngeal erythema or uvula swelling.     Tonsils: No tonsillar exudate or tonsillar abscesses.  Eyes:     General: Lids are normal.        Right eye: No discharge.        Left eye: No discharge.     Conjunctiva/sclera: Conjunctivae normal.     Right eye: No exudate.    Left eye: No exudate. Abdominal:     General: Abdomen is flat.     Palpations: Abdomen is soft.     Tenderness: There is no abdominal tenderness. There is no rebound.  Genitourinary:    Pubic Area: No rash or pubic lice.      Penis: Normal and circumcised. No erythema or discharge.      Testes: Normal.        Right: Mass or tenderness not present.        Left: Mass or tenderness not present.     Rectum: Normal.     Comments: Discharge amount: none Color: none Musculoskeletal:     Cervical back: Full passive range of motion without pain, normal range of motion and neck supple.  Lymphadenopathy:     Cervical: No cervical adenopathy.     Right cervical: No  superficial, deep or posterior cervical adenopathy.    Left cervical: No superficial, deep or posterior cervical adenopathy.     Upper Body:     Right upper body: No supraclavicular, axillary or epitrochlear adenopathy.     Left upper body: No supraclavicular, axillary or epitrochlear adenopathy.     Lower Body: No right inguinal adenopathy. No left inguinal adenopathy.  Skin:    General: Skin is warm and dry.     Findings: No lesion or rash.  Neurological:     Mental Status: He is alert and oriented to person, place, and time.  Psychiatric:        Attention and Perception: Attention normal.        Mood and Affect: Mood normal.        Speech: Speech normal.        Behavior: Behavior normal. Behavior is cooperative.       Assessment and Plan:  James Wong. is a 51 y.o. male  presenting to the [facility] for STI screening  1. Screening examination for venereal disease -51 year old male in clinic today for STD screening.  Patient reports being a contact to [facility], declines all additional screenings for STDs.  -Discussed importance of condom use for STI prevent.    2. Exposure to trichomonas -Treat patient today as a contact to [place].  Advised not to have sex for 7 days and 7 days after partner is treated.   - metroNIDAZOLE (FLAGYL) 500 MG tablet; Take 4 tablets (2,000 mg total) by mouth once for 1 dose.  Dispense: 4 tablet; Refill: 0   Total time spent: 30 minutes   Return if symptoms worsen or fail to improve.   Gregary Cromer, FNP

## 2022-09-08 NOTE — Progress Notes (Signed)
Pt here for contact to [facility].  Declines screening for STIs.  Metronidazole 500mg  #4 dispensed to patient.  Pt counseled on medication, side effects, plan of care and when to contact clinic for questions or concerns.  Verbalizes understanding of above.  Condoms declined.-Forest Becker, RN
